# Patient Record
Sex: Female | Born: 1975
Health system: Southern US, Community
[De-identification: ages and names within clinical notes are randomized; demographics above are authoritative.]

---

## 2003-07-07 ENCOUNTER — Other Ambulatory Visit: Admission: RE | Admit: 2003-07-07 | Discharge: 2003-07-07 | Payer: Self-pay | Admitting: Obstetrics and Gynecology

## 2007-11-02 ENCOUNTER — Ambulatory Visit: Payer: Self-pay | Admitting: Oncology

## 2009-05-14 ENCOUNTER — Encounter: Admission: RE | Admit: 2009-05-14 | Discharge: 2009-05-14 | Payer: Self-pay | Admitting: Obstetrics and Gynecology

## 2009-05-15 ENCOUNTER — Encounter: Admission: RE | Admit: 2009-05-15 | Discharge: 2009-05-15 | Payer: Self-pay | Admitting: Obstetrics and Gynecology

## 2010-09-16 ENCOUNTER — Encounter: Payer: Self-pay | Admitting: Obstetrics and Gynecology

## 2010-09-25 ENCOUNTER — Inpatient Hospital Stay (HOSPITAL_COMMUNITY): Admission: AD | Admit: 2010-09-25 | Payer: Self-pay | Admitting: Obstetrics and Gynecology

## 2011-02-25 ENCOUNTER — Other Ambulatory Visit: Payer: Self-pay | Admitting: Obstetrics and Gynecology

## 2011-02-25 ENCOUNTER — Inpatient Hospital Stay (HOSPITAL_COMMUNITY)
Admission: AD | Admit: 2011-02-25 | Discharge: 2011-02-28 | DRG: 765 | Disposition: A | Discharge status: Home / Self Care | Payer: 59 | Source: Ambulatory Visit | Attending: Obstetrics and Gynecology | Admitting: Obstetrics and Gynecology

## 2011-02-25 DIAGNOSIS — Z22330 Carrier of Group B streptococcus: Secondary | ICD-10-CM

## 2011-02-25 DIAGNOSIS — O09519 Supervision of elderly primigravida, unspecified trimester: Secondary | ICD-10-CM | POA: Diagnosis present

## 2011-02-25 DIAGNOSIS — D62 Acute posthemorrhagic anemia: Secondary | ICD-10-CM | POA: Diagnosis not present

## 2011-02-25 DIAGNOSIS — O9903 Anemia complicating the puerperium: Secondary | ICD-10-CM | POA: Diagnosis not present

## 2011-02-25 DIAGNOSIS — O99892 Other specified diseases and conditions complicating childbirth: Secondary | ICD-10-CM | POA: Diagnosis present

## 2011-02-25 LAB — CBC
HCT: 36.9 % (ref 36.0–46.0)
Hemoglobin: 12.7 g/dL (ref 12.0–15.0)
WBC: 12.2 10*3/uL — ABNORMAL HIGH (ref 4.0–10.5)

## 2011-02-26 LAB — CBC
HCT: 28.3 % — ABNORMAL LOW (ref 36.0–46.0)
Hemoglobin: 9.6 g/dL — ABNORMAL LOW (ref 12.0–15.0)
WBC: 13.8 10*3/uL — ABNORMAL HIGH (ref 4.0–10.5)

## 2011-02-26 LAB — RPR: RPR Ser Ql: NONREACTIVE

## 2011-02-27 NOTE — Op Note (Signed)
  NAMEMANUEL, Hailey [name]:  [id number]  MEDICAL RECORD NO.:  [id number]  LOCATION:  9138                          FACILITY:  [facility]  PHYSICIAN:  Maxie Better, M.D.DATE OF BIRTH:  1976-03-10  DATE OF PROCEDURE:  02/25/2011 DATE OF DISCHARGE:                              OPERATIVE REPORT   PREOPERATIVE DIAGNOSIS:  Fetal bradycardia, term gestation, nonreassuring fetal tracing.  PROCEDURE:  Emergency primary cesarean section Kerr hysterotomy.  POSTOPERATIVE DIAGNOSIS:  Fetal bradycardia, nonreassuring fetal tracing, term gestation.  ANESTHESIA:  Epidural.  SURGEON:  Maxie Better, M.D.  ASSISTANT:  None.  PROCEDURE IN DETAIL:  The patient was transferred to the operating room for an emergency cesarean section.  Epidural dosing was increased route to the operating room.  On arrival in the operating room, attempt at external fetal heart rate did not get a heart beat.  The internal scalp electrode had come off.  The patient was quickly prepped and draped.  A Pfannenstiel skin incision was then made, carried down to the rectus fascia.  Rectus fascia was bluntly opened and the parietal peritoneum was entered bluntly and extended.  The vesicouterine peritoneum was opened transversely.  The bladder bluntly dissected off the lower uterine segment.  Bladder retractor was then used to displace the bladder flap. Curvilinear incision was then made and bluntly extended.  Subsequent delivery of a live female from the left occiput posterior position was accomplished.  Baby was bulb suctioned.  The abdomen cord was clamped, cut.  The baby was transferred to awaiting pediatrician who assigned Apgars of 7 and 9 at 1 and 5 minutes.  Placenta was manually removed. Uterine cavity was cleaned of debris.  Uterine incision had no extension.  Cord pH  was 7.19. Cord blood was obtained.  Uterine incision was closed in two layers, the first layer with 0 Monocryl running  locked stitch, second layer was imbricated using 0 Monocryl suture.  Good hemostasis is noted.  Normal tubes and ovaries were noted bilaterally.  The abdomen was then copiously irrigated, suctioned of debris.  The peritoneal edges on the bladder surfaces were cauterized due to small bleeders with good hemostasis noted along the incision line.  The parietal peritoneum was closed with 0 Vicryl.  The rectus fascia was closed with 0 Vicryl x2.  The subcutaneous areas irrigated. Small bleeders cauterized, and interrupted 2-0 plain sutures was placed. Skin was subsequently approximated with Ethicon staples.  SPECIMENS:  Placenta sent to pathology.    COMPLICATIONS:  None.  INTRAOPERATIVE FLUID:  1900 mL.  URINE OUTPUT:  700 mL.  ESTIMATED BLOOD LOSS:  400 mL.  COUNTS:  Sponge and instrument counts x2 was correct.  Weight of the baby was 7 pounds and 6 ounces.  The baby was transferred to the regular nursery.  The patient tolerated the procedure well, was transferred to recovery in stable condition.     Maxie Better, M.D.[place]/MEDQ  D:  02/25/2011  T:  02/26/2011  Job:  [id number]  Electronically Signed by Nena Jordan Kaushal Vannice M.D. on 02/27/2011 06:36:33 AM

## 2011-02-28 NOTE — Discharge Summary (Signed)
  NAMEDARRIAN, [name]:  [id number]  MEDICAL RECORD NO.:  [id number]  LOCATION:  9138                          FACILITY:  [facility]  PHYSICIAN:  Maxie Better, M.D.DATE OF BIRTH:  [birth year]  DATE OF ADMISSION:  02/25/2011 DATE OF DISCHARGE:  02/28/2011                              DISCHARGE SUMMARY   ADMISSION DIAGNOSES:  Labor, term gestation.  DISCHARGE DIAGNOSES:  Term gestation, delivered status post emergency primary cesarean section for fetal bradycardia, nonreassuring fetal tracing, postoperative anemia.  PROCEDURES:  Emergency primary cesarean section, Kerr hysterotomy.  HISTORY OF PRESENT ILLNESS:  A 35 year old gravida 1, para 0 married white female at term presented in labor.  The patient had intact membranes.  Group B strep culture was positive.  Prenatal course had been unremarkable.  HOSPITAL COURSE:  The patient was admitted in early labor.  She was started on Penicillin prophylaxis.  She had epidural for pain management.  She had artificial rupture of membranes due to a fetal heart rate deceleration with clear amniotic fluid noted at that time. Intrauterine fetal scalp electrode and intrauterine pressure catheter were placed at that time.  Due to the fact that the tracing had spaced contractions every 4-4-1/2 minutes, the patient had Pitocin augmentation started.  The patient subsequently had variable decelerations for which amnioinfusion was also started at the time the intrauterine pressure catheter was placed.  She subsequently had deep variable decelerations resulting in a fetal bradycardia, which necessitated a stat primary cesarean section.  The patient underwent a primary cesarean section with resultant delivery of a live female, 7 pounds 6 ounces, and Apgars of 7 and 9, cord pH of 7.19.  Please see the dictated operative report for the specific detail.  The patient had an uncomplicated postoperative course.  Her CBC on postop day  #1 showed a hemoglobin of 9.6, hematocrit of 28.3, white count of 13.8, platelet count of 148,000.  On postop day #3, the patient was tolerating a regular diet, remained afebrile. Incision had no evidence of infection.  Staples were removed.  Steri- Strips placed.  The patient was deemed well to be discharged home. Disposition is home.  Condition stable.  DISCHARGE MEDICATIONS:  Tylox 1-2 tablets every 3-4 hours p.r.n. pain, Motrin 800 mg 1 p.o. q.6-8 hours p.r.n. pain, Valtrex 500 mg 1 p.o. daily.  Followup appointment at [facility] in 6 weeks.  Discharge instructions per the postpartum booklet given and reviewed with the patient.     Maxie Better, M.D.     [place]/MEDQ  D:  02/28/2011  T:  02/28/2011  Job:  [id number]  Electronically Signed by Nena Jordan Colton Tassin M.D. on 02/28/2011 10:07:01 PM

## 2011-03-03 ENCOUNTER — Inpatient Hospital Stay (HOSPITAL_COMMUNITY)
Admission: AD | Admit: 2011-03-03 | Payer: BC Managed Care – PPO | Source: Ambulatory Visit | Admitting: Obstetrics and Gynecology

## 2012-06-01 ENCOUNTER — Other Ambulatory Visit: Payer: Self-pay | Admitting: Obstetrics and Gynecology

## 2012-06-01 DIAGNOSIS — Z1231 Encounter for screening mammogram for malignant neoplasm of breast: Secondary | ICD-10-CM

## 2012-07-01 ENCOUNTER — Ambulatory Visit
Admission: RE | Admit: 2012-07-01 | Discharge: 2012-07-01 | Disposition: A | Discharge status: Home / Self Care | Payer: BC Managed Care – PPO | Source: Ambulatory Visit | Attending: Obstetrics and Gynecology | Admitting: Obstetrics and Gynecology

## 2012-07-01 DIAGNOSIS — Z1231 Encounter for screening mammogram for malignant neoplasm of breast: Secondary | ICD-10-CM

## 2013-06-06 ENCOUNTER — Other Ambulatory Visit: Payer: Self-pay

## 2013-06-06 DIAGNOSIS — Z1231 Encounter for screening mammogram for malignant neoplasm of breast: Secondary | ICD-10-CM

## 2013-07-05 ENCOUNTER — Ambulatory Visit
Admission: RE | Admit: 2013-07-05 | Discharge: 2013-07-05 | Disposition: A | Discharge status: Home / Self Care | Payer: BC Managed Care – PPO | Source: Ambulatory Visit

## 2013-07-05 DIAGNOSIS — Z1231 Encounter for screening mammogram for malignant neoplasm of breast: Secondary | ICD-10-CM

## 2014-07-31 ENCOUNTER — Other Ambulatory Visit: Payer: Self-pay

## 2014-07-31 DIAGNOSIS — Z1231 Encounter for screening mammogram for malignant neoplasm of breast: Secondary | ICD-10-CM

## 2014-09-07 ENCOUNTER — Ambulatory Visit: Payer: BC Managed Care – PPO

## 2016-05-14 DIAGNOSIS — L814 Other melanin hyperpigmentation: Secondary | ICD-10-CM | POA: Diagnosis not present

## 2016-05-14 DIAGNOSIS — D225 Melanocytic nevi of trunk: Secondary | ICD-10-CM | POA: Diagnosis not present

## 2016-05-14 DIAGNOSIS — D2271 Melanocytic nevi of right lower limb, including hip: Secondary | ICD-10-CM | POA: Diagnosis not present

## 2016-05-14 DIAGNOSIS — L821 Other seborrheic keratosis: Secondary | ICD-10-CM | POA: Diagnosis not present

## 2016-10-30 DIAGNOSIS — D485 Neoplasm of uncertain behavior of skin: Secondary | ICD-10-CM | POA: Diagnosis not present

## 2016-11-05 DIAGNOSIS — Z6821 Body mass index (BMI) 21.0-21.9, adult: Secondary | ICD-10-CM | POA: Diagnosis not present

## 2016-11-05 DIAGNOSIS — Z01419 Encounter for gynecological examination (general) (routine) without abnormal findings: Secondary | ICD-10-CM | POA: Diagnosis not present

## 2016-11-05 DIAGNOSIS — Z1151 Encounter for screening for human papillomavirus (HPV): Secondary | ICD-10-CM | POA: Diagnosis not present

## 2016-11-05 DIAGNOSIS — R87610 Atypical squamous cells of undetermined significance on cytologic smear of cervix (ASC-US): Secondary | ICD-10-CM | POA: Diagnosis not present

## 2016-11-05 DIAGNOSIS — Z1231 Encounter for screening mammogram for malignant neoplasm of breast: Secondary | ICD-10-CM | POA: Diagnosis not present

## 2016-11-19 DIAGNOSIS — B078 Other viral warts: Secondary | ICD-10-CM | POA: Diagnosis not present

## 2017-03-10 DIAGNOSIS — H6123 Impacted cerumen, bilateral: Secondary | ICD-10-CM | POA: Diagnosis not present

## 2017-07-15 DIAGNOSIS — D224 Melanocytic nevi of scalp and neck: Secondary | ICD-10-CM | POA: Diagnosis not present

## 2017-07-15 DIAGNOSIS — D225 Melanocytic nevi of trunk: Secondary | ICD-10-CM | POA: Diagnosis not present

## 2017-07-15 DIAGNOSIS — D2262 Melanocytic nevi of left upper limb, including shoulder: Secondary | ICD-10-CM | POA: Diagnosis not present

## 2017-07-15 DIAGNOSIS — D2261 Melanocytic nevi of right upper limb, including shoulder: Secondary | ICD-10-CM | POA: Diagnosis not present

## 2018-01-05 DIAGNOSIS — Z1151 Encounter for screening for human papillomavirus (HPV): Secondary | ICD-10-CM | POA: Diagnosis not present

## 2018-01-05 DIAGNOSIS — Z01419 Encounter for gynecological examination (general) (routine) without abnormal findings: Secondary | ICD-10-CM | POA: Diagnosis not present

## 2018-01-05 DIAGNOSIS — Z1231 Encounter for screening mammogram for malignant neoplasm of breast: Secondary | ICD-10-CM | POA: Diagnosis not present

## 2018-01-05 DIAGNOSIS — Z6821 Body mass index (BMI) 21.0-21.9, adult: Secondary | ICD-10-CM | POA: Diagnosis not present

## 2018-01-10 DIAGNOSIS — S40262A Insect bite (nonvenomous) of left shoulder, initial encounter: Secondary | ICD-10-CM | POA: Diagnosis not present

## 2018-01-10 DIAGNOSIS — L089 Local infection of the skin and subcutaneous tissue, unspecified: Secondary | ICD-10-CM | POA: Diagnosis not present

## 2018-01-10 DIAGNOSIS — S40862A Insect bite (nonvenomous) of left upper arm, initial encounter: Secondary | ICD-10-CM | POA: Diagnosis not present

## 2018-01-10 DIAGNOSIS — W57XXXA Bitten or stung by nonvenomous insect and other nonvenomous arthropods, initial encounter: Secondary | ICD-10-CM | POA: Diagnosis not present

## 2018-02-10 ENCOUNTER — Ambulatory Visit (INDEPENDENT_AMBULATORY_CARE_PROVIDER_SITE_OTHER): Payer: BLUE CROSS/BLUE SHIELD | Admitting: Physician Assistant

## 2018-02-10 ENCOUNTER — Other Ambulatory Visit: Payer: Self-pay

## 2018-02-10 ENCOUNTER — Encounter: Payer: Self-pay | Admitting: Physician Assistant

## 2018-02-10 VITALS — BP 92/60 | HR 55 | Temp 97.7°F | Resp 16 | Ht 68.0 in | Wt 138.6 lb

## 2018-02-10 DIAGNOSIS — F418 Other specified anxiety disorders: Secondary | ICD-10-CM

## 2018-02-10 NOTE — Patient Instructions (Signed)
We could consider low dose propanolol for times when you know you have a long drive.  An SSRI, like lexapro, can be helpful with anxiety an form of anxiety, but it is not taken as needed, and may decrease concern for other things.

## 2018-02-10 NOTE — Progress Notes (Signed)
02/10/2018 10:58 AM   DOB: 08-15-1976 / MRN: [id number]  SUBJECTIVE:  Hailey Alexander is a 42 y.o. female presenting for anxiety.  She tells me that she has a fear of driving in heavy traffic and states that "I am white knuckled into reactive behind the wheel."  She denies any road rage symptoms and prefers to stay in the right-hand lane to let other cars passed without difficulty.  She is essentially worried that somebody will not be paying attention and hit her while she is driving her car.  She sees Laurance Flatten often for life decisions and general counseling and Pamala Hurry recommended that she come here and talk to me about the possibility of medications.  Patient denies any other symptoms of anxiety does not think about her drive at night, before her drive in the morning or her drive home at night until she is actually behind the wheel.  Symptoms present for 4 years now and worsening. She has tried talking to her therapist about the problem.  She sleeps well and denies feelings of sadness and anhedonia.  Depression screen PHQ 2/9 02/10/2018  Decreased Interest 0  Down, Depressed, Hopeless 0  PHQ - 2 Score 0   She is allergic to codeine.   Review of Systems  Constitutional: Negative for chills and fever.  Skin: Negative for itching and rash.  Psychiatric/Behavioral: Negative for depression, hallucinations, memory loss, substance abuse and suicidal ideas. The patient is nervous/anxious. The patient does not have insomnia.     The problem list and medications were reviewed and updated by myself where necessary and exist elsewhere in the encounter.   OBJECTIVE:  BP 92/60 (BP Location: Right Arm, Patient Position: Sitting, Cuff Size: Normal)   Pulse (!) 55   Temp 97.7 F (36.5 C) (Oral)   Resp 16   Ht 5' 8"  (1.727 m)   Wt 138 lb 9.6 oz (62.9 kg)   LMP 01/27/2018   SpO2 99%   BMI 21.07 kg/m   Wt Readings from Last 3 Encounters:  02/10/18 138 lb 9.6 oz (62.9 kg)   Temp Readings  from Last 3 Encounters:  02/10/18 97.7 F (36.5 C) (Oral)   BP Readings from Last 3 Encounters:  02/10/18 92/60   Pulse Readings from Last 3 Encounters:  02/10/18 (!) 55    Physical Exam  Constitutional: She is oriented to person, place, and time. She appears well-nourished. No distress.  Eyes: Pupils are equal, round, and reactive to light. EOM are normal.  Cardiovascular: Normal rate.  Pulmonary/Chest: Effort normal.  Abdominal: She exhibits no distension.  Neurological: She is alert and oriented to person, place, and time. She has normal reflexes. No cranial nerve deficit or sensory deficit. Gait normal.  Skin: Skin is dry. She is not diaphoretic.  Psychiatric: She has a normal mood and affect. Her behavior is normal. Judgment and thought content normal. Her mood appears not anxious. Her affect is not angry, not blunt, not labile and not inappropriate. Her speech is not rapid and/or pressured. She is not agitated and not actively hallucinating. Cognition and memory are not impaired. She does not express impulsivity or inappropriate judgment. She does not exhibit a depressed mood. She exhibits normal recent memory and normal remote memory. She is attentive.  Vitals reviewed.       ASSESSMENT AND PLAN:  Anastasya was seen today for anxiety.  Diagnoses and all orders for this visit:  Situational anxiety: I think her fear of driving is within the  spectrum of normal.  She does not ruminate on driving before or after her drive and it does not appear that there are any other instances in her life that cause her undue anxiety.  I think her fears of driving her proportional to living [place]  [place] that [place] major highway [place].  I talked about the benefits of an SSRI and feel that this may be too much for her symptoms given that her anxiousness is isolated to essentially busy roadways.  We could consider propanolol for long drives.    The patient is advised to call or return to  clinic if she does not see an improvement in symptoms, or to seek the care of the closest emergency department if she worsens with the above plan.   Philis Fendt, MHS, PA-C [facility] 02/10/2018 10:58 AM

## 2018-08-16 DIAGNOSIS — D2261 Melanocytic nevi of right upper limb, including shoulder: Secondary | ICD-10-CM | POA: Diagnosis not present

## 2018-08-16 DIAGNOSIS — D2262 Melanocytic nevi of left upper limb, including shoulder: Secondary | ICD-10-CM | POA: Diagnosis not present

## 2018-08-16 DIAGNOSIS — D225 Melanocytic nevi of trunk: Secondary | ICD-10-CM | POA: Diagnosis not present

## 2018-08-16 DIAGNOSIS — D2271 Melanocytic nevi of right lower limb, including hip: Secondary | ICD-10-CM | POA: Diagnosis not present

## 2018-09-27 DIAGNOSIS — M9903 Segmental and somatic dysfunction of lumbar region: Secondary | ICD-10-CM | POA: Diagnosis not present

## 2018-09-27 DIAGNOSIS — M545 Low back pain: Secondary | ICD-10-CM | POA: Diagnosis not present

## 2018-09-27 DIAGNOSIS — M9905 Segmental and somatic dysfunction of pelvic region: Secondary | ICD-10-CM | POA: Diagnosis not present

## 2018-09-27 DIAGNOSIS — M9904 Segmental and somatic dysfunction of sacral region: Secondary | ICD-10-CM | POA: Diagnosis not present

## 2018-09-29 DIAGNOSIS — M9903 Segmental and somatic dysfunction of lumbar region: Secondary | ICD-10-CM | POA: Diagnosis not present

## 2018-09-29 DIAGNOSIS — M9905 Segmental and somatic dysfunction of pelvic region: Secondary | ICD-10-CM | POA: Diagnosis not present

## 2018-09-29 DIAGNOSIS — M9904 Segmental and somatic dysfunction of sacral region: Secondary | ICD-10-CM | POA: Diagnosis not present

## 2018-09-29 DIAGNOSIS — M545 Low back pain: Secondary | ICD-10-CM | POA: Diagnosis not present

## 2018-11-01 DIAGNOSIS — M545 Low back pain: Secondary | ICD-10-CM | POA: Diagnosis not present

## 2018-11-05 DIAGNOSIS — M545 Low back pain: Secondary | ICD-10-CM | POA: Diagnosis not present

## 2019-02-07 DIAGNOSIS — R300 Dysuria: Secondary | ICD-10-CM | POA: Diagnosis not present

## 2019-02-07 DIAGNOSIS — N3000 Acute cystitis without hematuria: Secondary | ICD-10-CM | POA: Diagnosis not present

## 2019-03-10 DIAGNOSIS — Z1151 Encounter for screening for human papillomavirus (HPV): Secondary | ICD-10-CM | POA: Diagnosis not present

## 2019-03-10 DIAGNOSIS — Z6821 Body mass index (BMI) 21.0-21.9, adult: Secondary | ICD-10-CM | POA: Diagnosis not present

## 2019-03-10 DIAGNOSIS — Z1231 Encounter for screening mammogram for malignant neoplasm of breast: Secondary | ICD-10-CM | POA: Diagnosis not present

## 2019-03-10 DIAGNOSIS — Z01419 Encounter for gynecological examination (general) (routine) without abnormal findings: Secondary | ICD-10-CM | POA: Diagnosis not present

## 2019-03-14 ENCOUNTER — Other Ambulatory Visit: Payer: Self-pay | Admitting: Obstetrics and Gynecology

## 2019-03-14 DIAGNOSIS — E049 Nontoxic goiter, unspecified: Secondary | ICD-10-CM

## 2019-03-17 ENCOUNTER — Ambulatory Visit
Admission: RE | Admit: 2019-03-17 | Discharge: 2019-03-17 | Disposition: A | Discharge status: Home / Self Care | Payer: BC Managed Care – PPO | Source: Ambulatory Visit | Attending: Obstetrics and Gynecology | Admitting: Obstetrics and Gynecology

## 2019-03-17 DIAGNOSIS — E042 Nontoxic multinodular goiter: Secondary | ICD-10-CM | POA: Diagnosis not present

## 2019-03-17 DIAGNOSIS — E049 Nontoxic goiter, unspecified: Secondary | ICD-10-CM

## 2019-03-22 ENCOUNTER — Other Ambulatory Visit: Payer: Self-pay | Admitting: Obstetrics and Gynecology

## 2019-03-22 DIAGNOSIS — E041 Nontoxic single thyroid nodule: Secondary | ICD-10-CM

## 2019-04-07 ENCOUNTER — Other Ambulatory Visit (HOSPITAL_COMMUNITY)
Admission: RE | Admit: 2019-04-07 | Discharge: 2019-04-07 | Disposition: A | Discharge status: Home / Self Care | Payer: BC Managed Care – PPO | Source: Ambulatory Visit | Attending: Radiology | Admitting: Radiology

## 2019-04-07 ENCOUNTER — Ambulatory Visit
Admission: RE | Admit: 2019-04-07 | Discharge: 2019-04-07 | Disposition: A | Discharge status: Home / Self Care | Payer: BC Managed Care – PPO | Source: Ambulatory Visit | Attending: Obstetrics and Gynecology | Admitting: Obstetrics and Gynecology

## 2019-04-07 DIAGNOSIS — E041 Nontoxic single thyroid nodule: Secondary | ICD-10-CM

## 2019-04-07 DIAGNOSIS — D34 Benign neoplasm of thyroid gland: Secondary | ICD-10-CM | POA: Diagnosis not present

## 2019-05-26 DIAGNOSIS — D485 Neoplasm of uncertain behavior of skin: Secondary | ICD-10-CM | POA: Diagnosis not present

## 2019-10-20 DIAGNOSIS — L720 Epidermal cyst: Secondary | ICD-10-CM | POA: Diagnosis not present

## 2019-10-20 DIAGNOSIS — D2271 Melanocytic nevi of right lower limb, including hip: Secondary | ICD-10-CM | POA: Diagnosis not present

## 2019-10-20 DIAGNOSIS — D225 Melanocytic nevi of trunk: Secondary | ICD-10-CM | POA: Diagnosis not present

## 2019-10-20 DIAGNOSIS — D2261 Melanocytic nevi of right upper limb, including shoulder: Secondary | ICD-10-CM | POA: Diagnosis not present

## 2020-05-02 ENCOUNTER — Other Ambulatory Visit: Payer: Self-pay | Admitting: Body Imaging

## 2020-05-02 DIAGNOSIS — Z1231 Encounter for screening mammogram for malignant neoplasm of breast: Secondary | ICD-10-CM

## 2020-06-05 ENCOUNTER — Other Ambulatory Visit: Payer: Self-pay

## 2020-06-05 ENCOUNTER — Ambulatory Visit
Admission: RE | Admit: 2020-06-05 | Discharge: 2020-06-05 | Disposition: A | Discharge status: Home / Self Care | Payer: BC Managed Care – PPO | Source: Ambulatory Visit | Attending: Body Imaging | Admitting: Body Imaging

## 2020-06-05 DIAGNOSIS — Z1231 Encounter for screening mammogram for malignant neoplasm of breast: Secondary | ICD-10-CM

## 2020-06-21 DIAGNOSIS — Z124 Encounter for screening for malignant neoplasm of cervix: Secondary | ICD-10-CM | POA: Diagnosis not present

## 2020-06-21 DIAGNOSIS — Z13220 Encounter for screening for lipoid disorders: Secondary | ICD-10-CM | POA: Diagnosis not present

## 2020-06-21 DIAGNOSIS — Z13228 Encounter for screening for other metabolic disorders: Secondary | ICD-10-CM | POA: Diagnosis not present

## 2020-06-21 DIAGNOSIS — N951 Menopausal and female climacteric states: Secondary | ICD-10-CM | POA: Diagnosis not present

## 2020-06-21 DIAGNOSIS — Z131 Encounter for screening for diabetes mellitus: Secondary | ICD-10-CM | POA: Diagnosis not present

## 2020-06-21 DIAGNOSIS — Z1329 Encounter for screening for other suspected endocrine disorder: Secondary | ICD-10-CM | POA: Diagnosis not present

## 2020-06-21 DIAGNOSIS — Z01419 Encounter for gynecological examination (general) (routine) without abnormal findings: Secondary | ICD-10-CM | POA: Diagnosis not present

## 2020-07-04 IMAGING — US ULTRASOUND FNA BIOPSY THYROID 1ST LESION
1 series · 13 of 17 positions shown · non-contrast
Comparison: Thyroid ultrasound dated 03/17/2019

MEDICATIONS:
None

COMPLICATIONS:
None immediate.

INDICATION: Patient with history of thyromegaly and thyroid ultrasound on
03/17/2019 which revealed bilateral nodules with a 4 cm cystic/solid
inferior right nodule which meets criteria for biopsy. She presents
today for the procedure.

EXAM:
ULTRASOUND GUIDED FINE NEEDLE ASPIRATION BIOPSY OF RIGHT INFERIOR
CYSTIC/SOLID THYROID NODULE
TECHNIQUE: Informed written consent was obtained from the patient after a
discussion of the risks, benefits and alternatives to treatment.
Questions regarding the procedure were encouraged and answered. A
timeout was performed prior to the initiation of the procedure.

[Series 1: ultrasound fna biopsy thyroid 1st lesion · 0.06mm/px · 17 acquisitions, 13 frames shown]
[im 1/17]
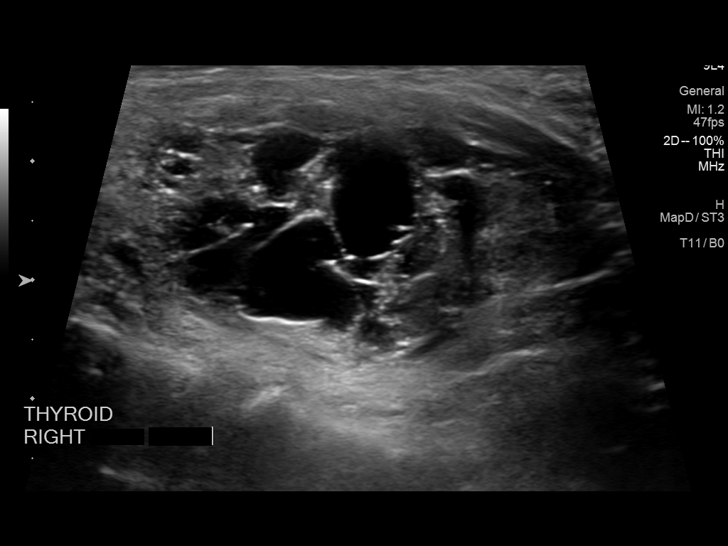
[im 2/17]
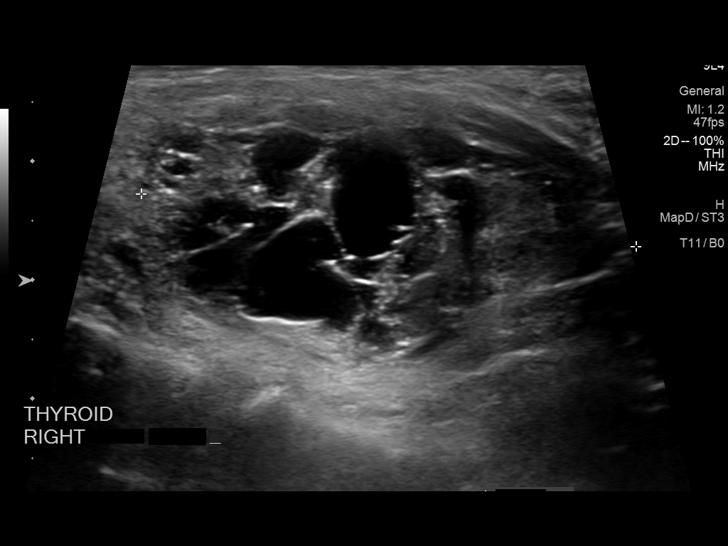
[im 4/17]
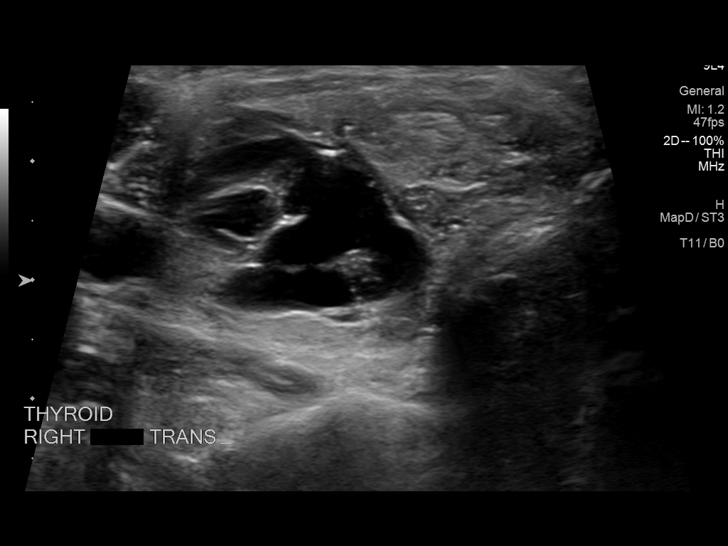
[im 5/17]
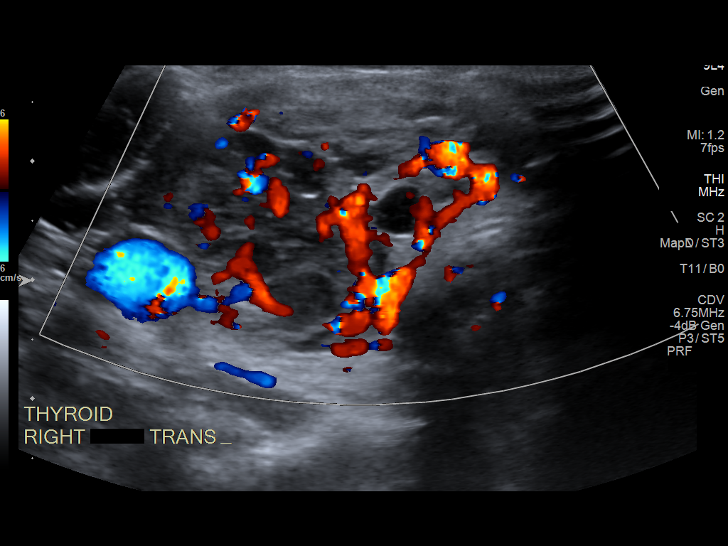
[im 6/17]
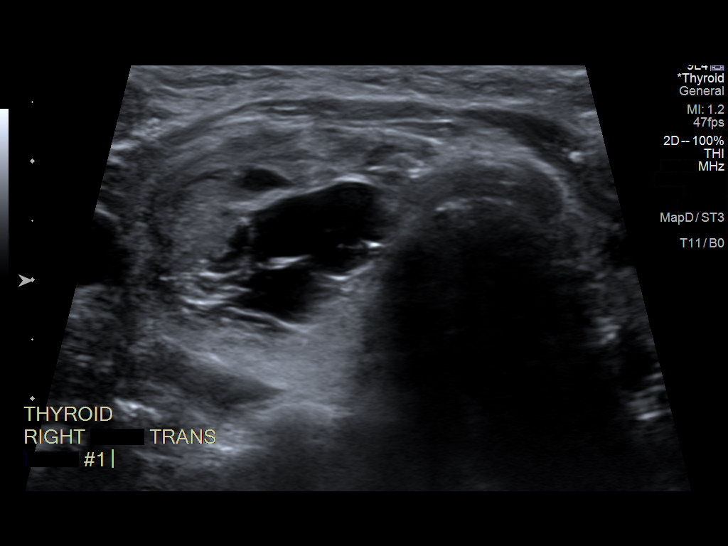
[im 8/17]
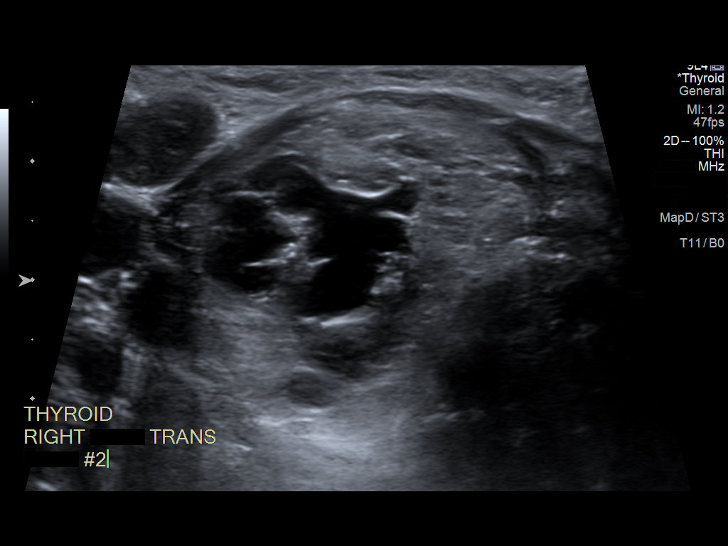
[im 9/17]
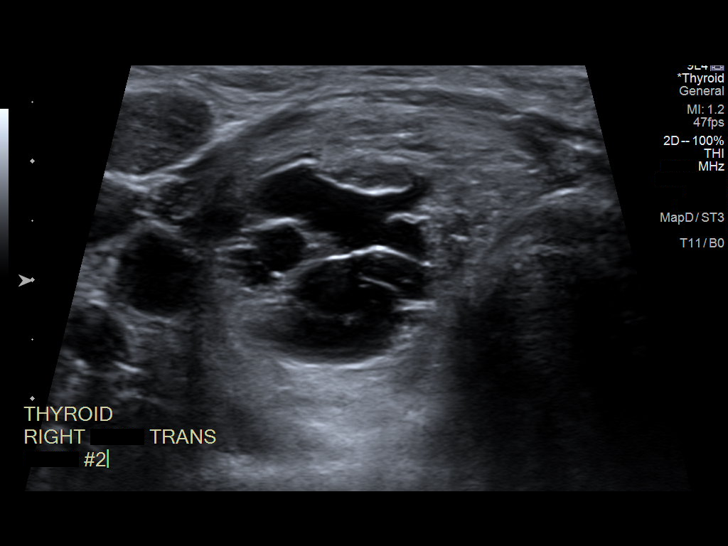
[im 10/17]
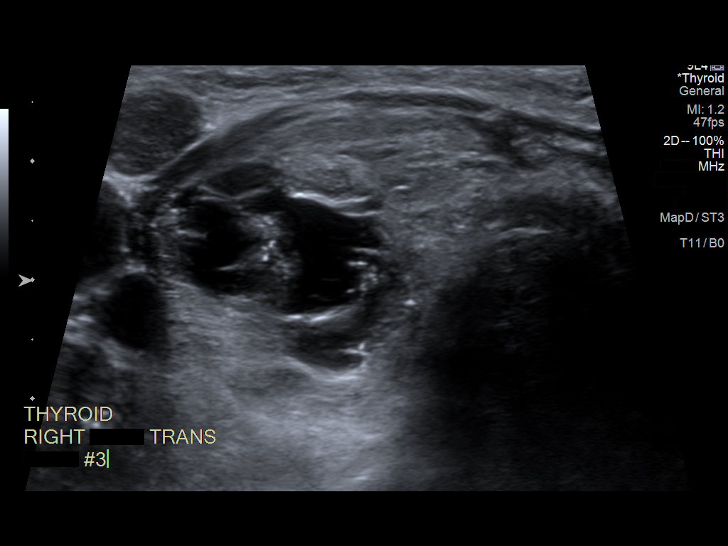
[im 12/17]
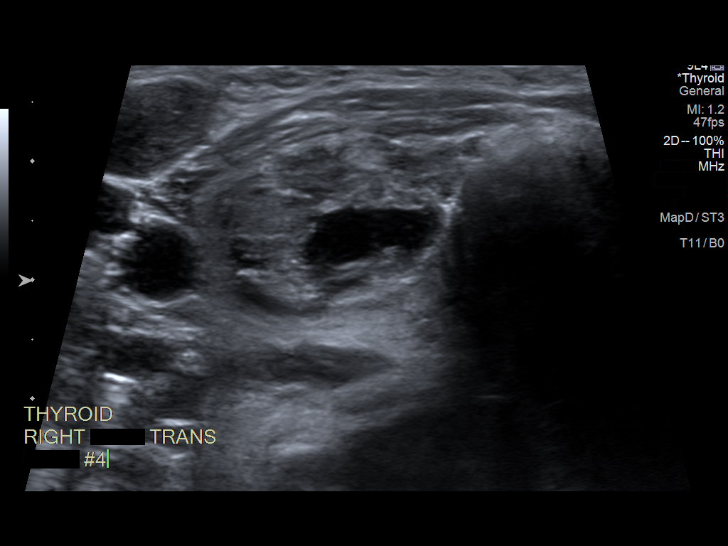
[im 13/17]
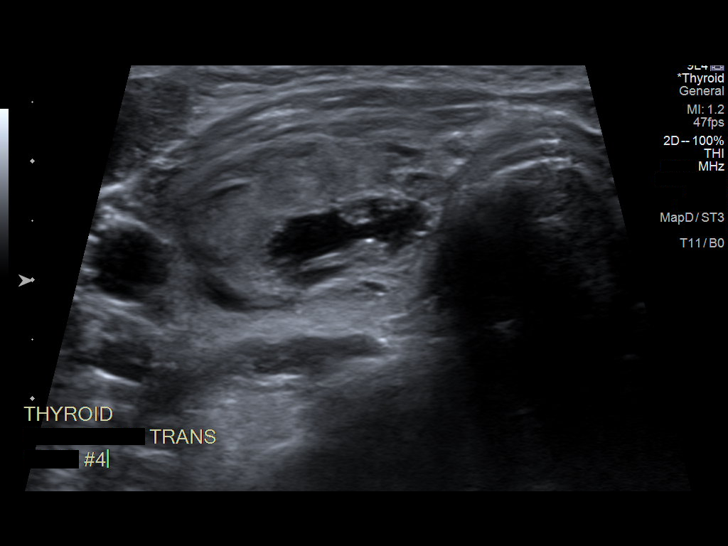
[im 14/17]
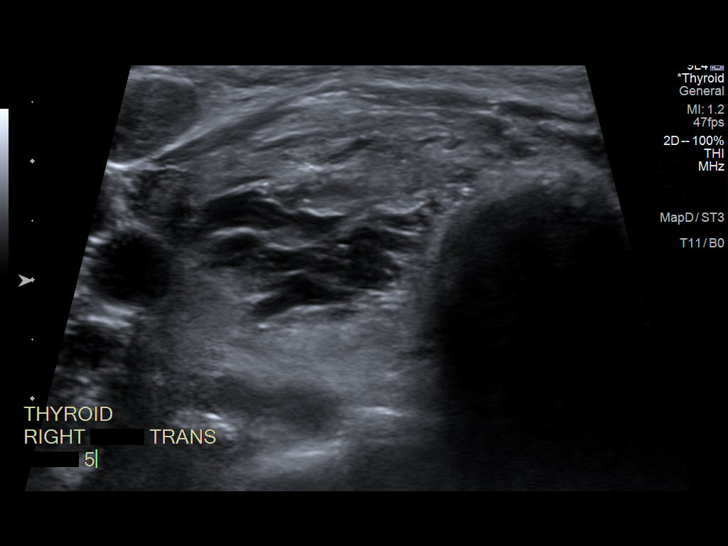
[im 16/17]
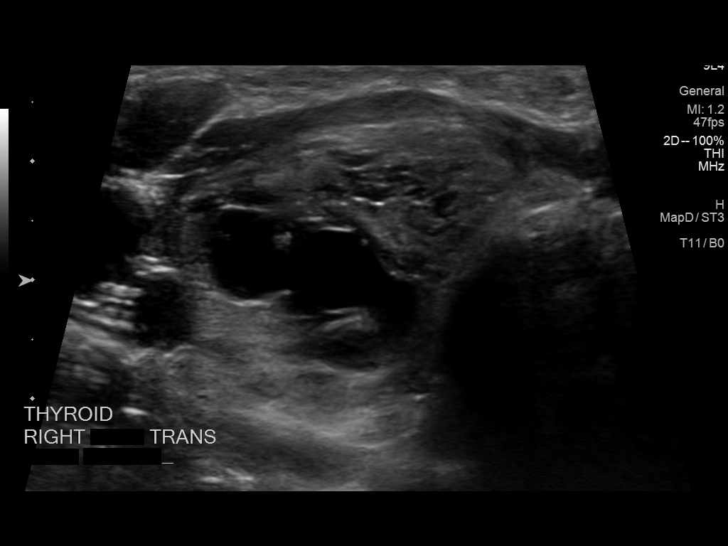
[im 17/17]
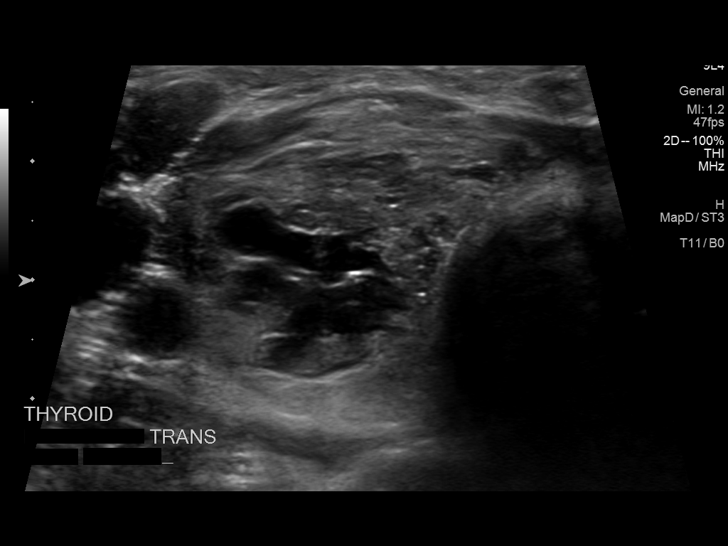

[13 of 17 positions shown; findings below may reference images not displayed]

Pre-procedural ultrasound scanning demonstrated unchanged size and
appearance of the indeterminate nodule within the right inferior
thyroid lobe

The procedure was planned. The neck was prepped in the usual sterile
fashion, and a sterile drape was applied covering the operative
field. A timeout was performed prior to the initiation of the
procedure. Local anesthesia was provided with 1% lidocaine.

Under direct ultrasound guidance, 5 FNA biopsies were performed of
the right inferior thyroid solid/cystic nodule with 25 gauge
needles. Multiple ultrasound images were saved for procedural
documentation purposes. The samples were prepared and submitted to
pathology as well as for Afirma testing. Approximately 2-3 cc of
light blood-tinged fluid was removed from the cystic portion.

Limited post procedural scanning was negative for hematoma or
additional complication. Dressings were placed. The patient
tolerated the above procedures procedure well without immediate
postprocedural complication.
FINDINGS: Nodule reference number based on prior diagnostic ultrasound: 3

Maximum size: 4 cm

Location: Right; Inferior

ACR TI-RADS risk category: TR3 (3 points)

Reason for biopsy: meets ACR TI-RADS criteria

Ultrasound imaging confirms appropriate placement of the needles
within the right inferior thyroid nodule.
IMPRESSION: Technically successful ultrasound guided fine needle aspiration
biopsy of solid/cystic right inferior thyroid nodule. Final
pathology pending.

## 2020-08-10 IMAGING — US US THYROID
1 series · 13 of 25 positions shown · non-contrast
Comparison: None.

CLINICAL DATA: Thyromegaly

EXAM:
THYROID ULTRASOUND
TECHNIQUE: Ultrasound examination of the thyroid gland and adjacent soft
tissues was performed.

[Series 1: us thyroid · 0.07mm/px · 13 of 51 slices shown]
[im 1/51]
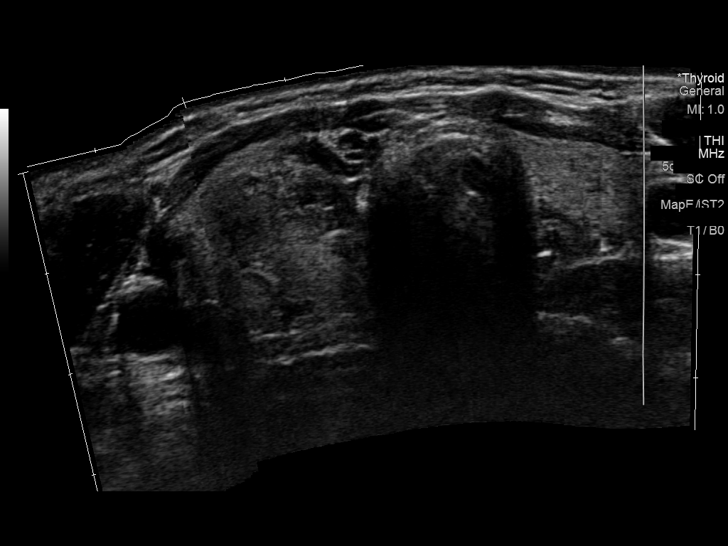
[im 5/51]
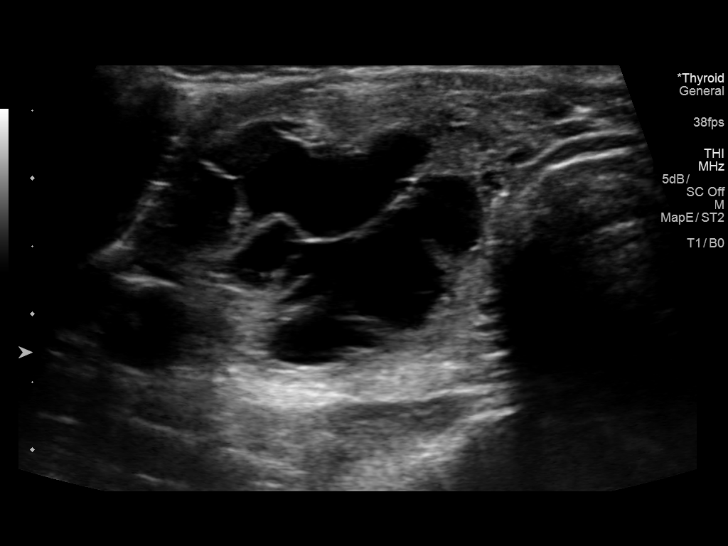
[im 9/51]
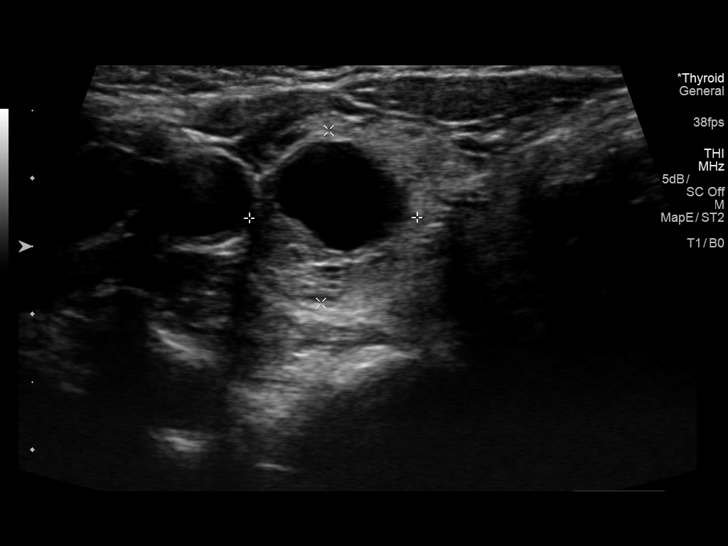
[im 13/51]
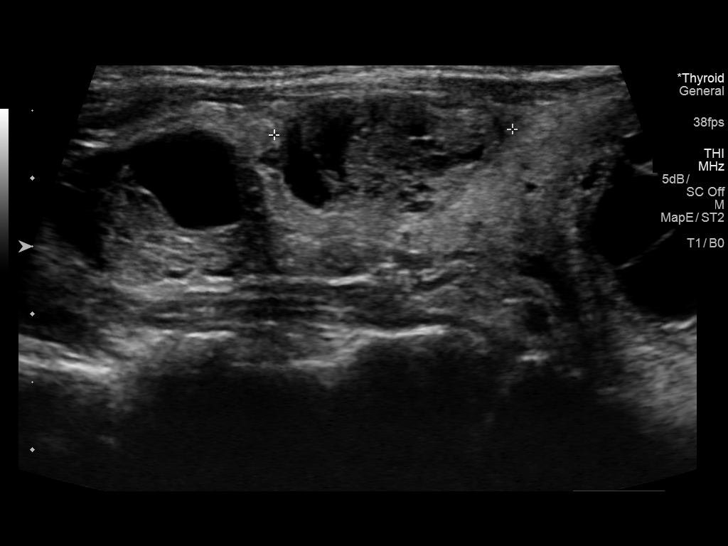
[im 17/51]
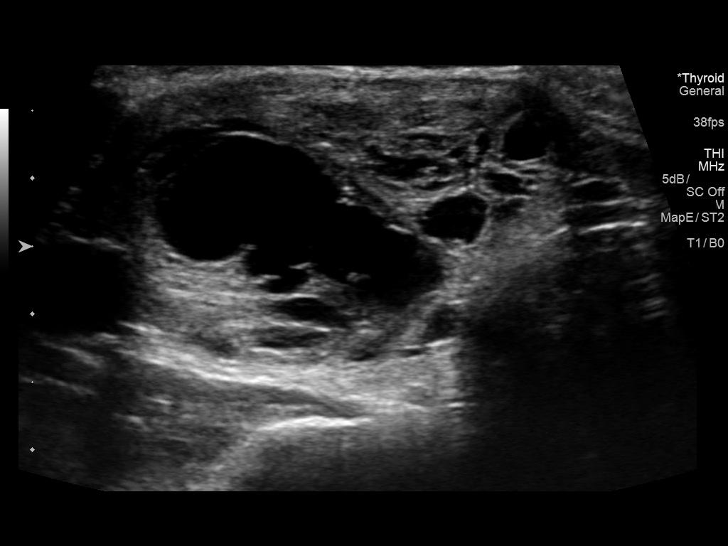
[im 21/51]
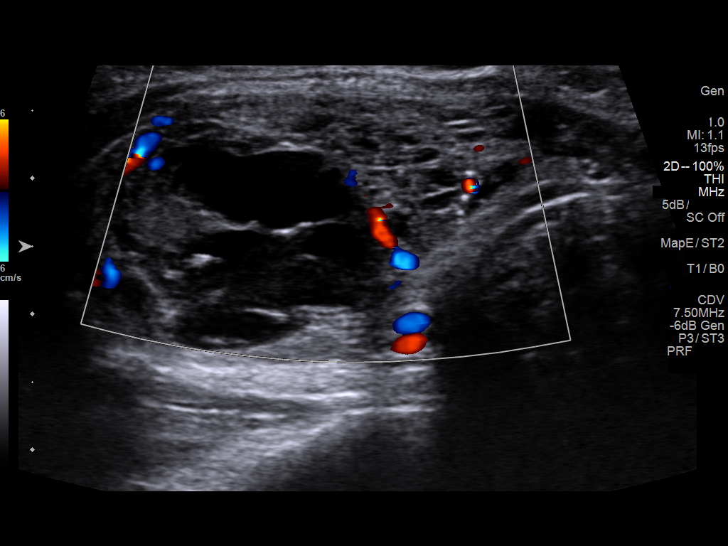
[im 26/51]
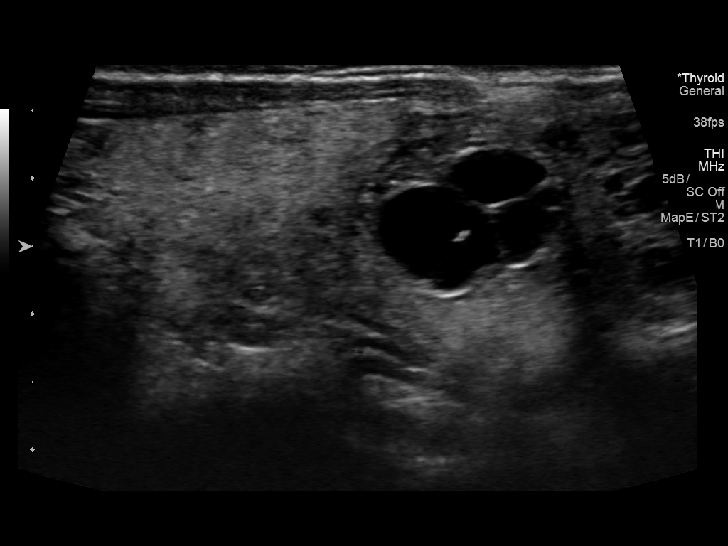
[im 30/51]
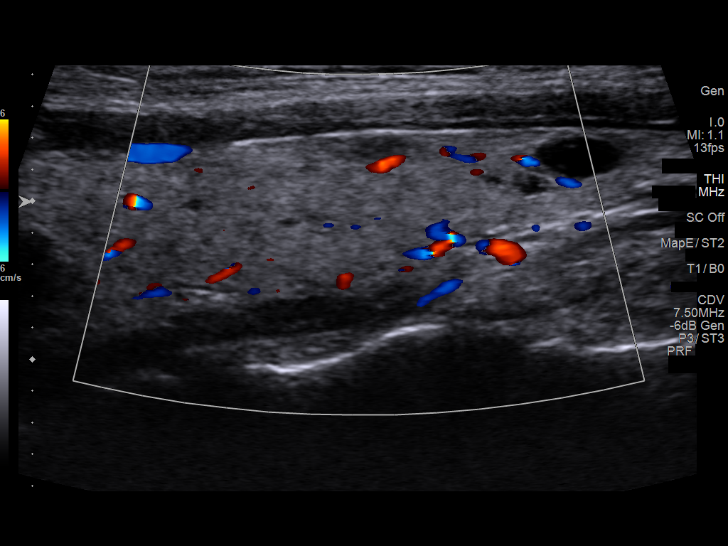
[im 34/51]
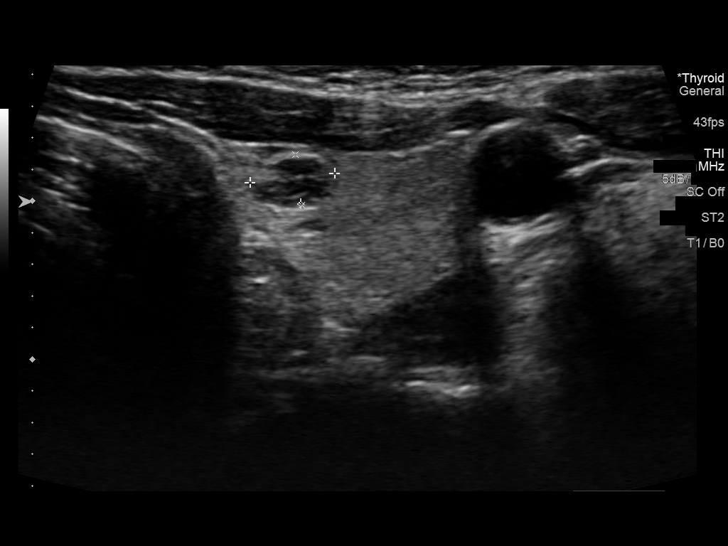
[im 38/51]
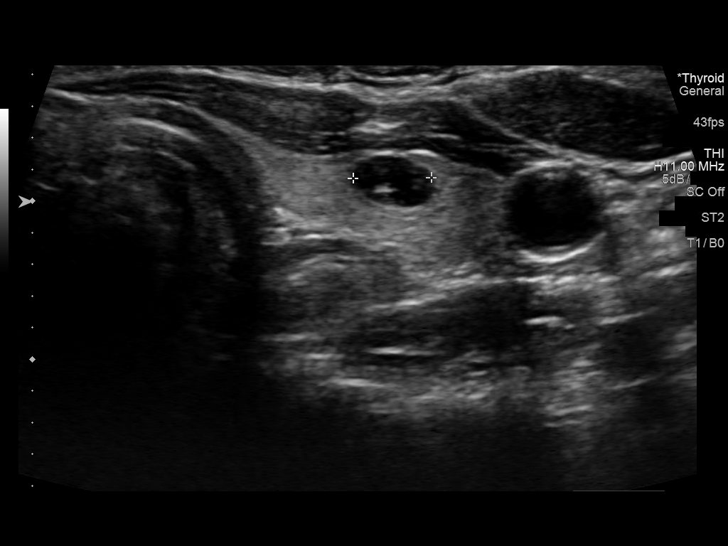
[im 42/51]
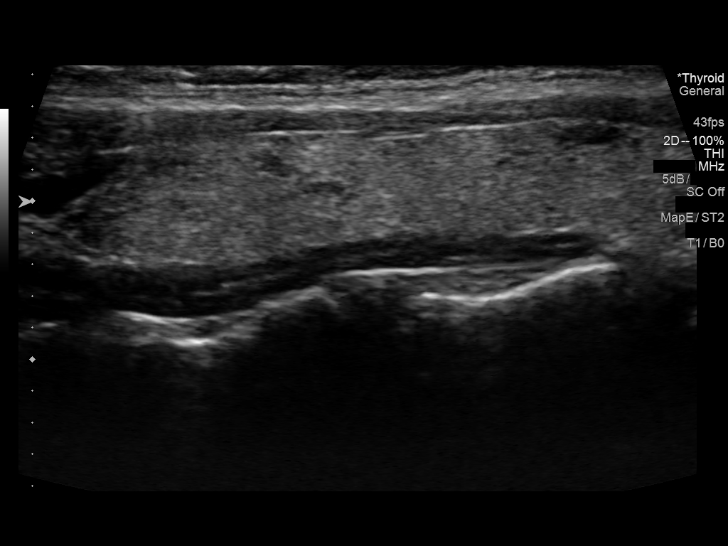
[im 46/51]
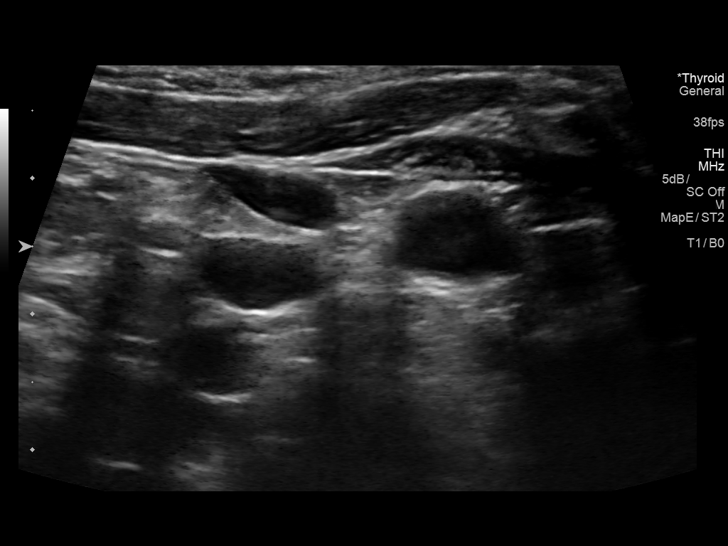
[im 51/51]
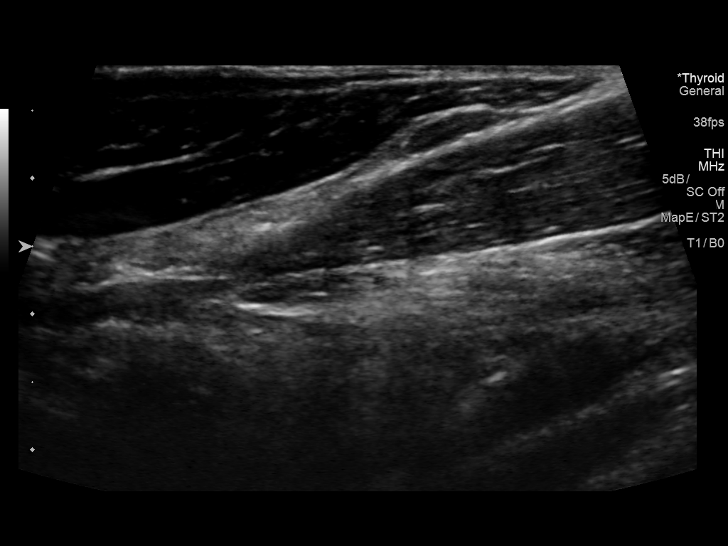

[13 of 25 positions shown; findings below may reference images not displayed]

FINDINGS: Parenchymal Echotexture: Mildly heterogenous

Isthmus: 0.2 cm thickness

Right lobe: 8 x 2.4 x 2.7 cm

Left lobe: 5.2 x 0.8 x 1.8 cm

_________________________________________________________

Estimated total number of nodules >/= 1 cm: 3

Number of spongiform nodules >/=  2 cm not described below (TR1): 0

Number of mixed cystic and solid nodules >/= 1.5 cm not described
below (TR2): 0

_________________________________________________________

Nodule # 1:

Location: Right; Superior

Maximum size: 1.7 cm; Other 2 dimensions: 1.3 x 1.2 cm

Composition: mixed cystic and solid (1)

Echogenicity: isoechoic (1)

Shape: not taller-than-wide (0)

Margins: smooth (0)

Echogenic foci: none (0)

ACR TI-RADS total points: 2.

ACR TI-RADS risk category: TR2 (2 points).

ACR TI-RADS recommendations:

This nodule does NOT meet TI-RADS criteria for biopsy or dedicated
follow-up.

_________________________________________________________

Nodule # 2:

Location: Right; Mid

Maximum size: 1.8 cm; Other 2 dimensions: 1.5 x 1 cm

Composition: spongiform (0)

Echogenicity: hypoechoic (2)

Shape: not taller-than-wide (0)

Margins: ill-defined (0)

Echogenic foci: none (0)

ACR TI-RADS total points: 2.

ACR TI-RADS risk category: TR2 (2 points).

ACR TI-RADS recommendations:

This nodule does NOT meet TI-RADS criteria for biopsy or dedicated
follow-up.

_________________________________________________________

Nodule # 3:

Location: Right; Inferior

Maximum size: 4 cm; Other 2 dimensions: 3.1 x 2.1 cm

Composition: mixed cystic and solid (1)

Echogenicity: hypoechoic (2)

Shape: not taller-than-wide (0)

Margins: ill-defined (0)

Echogenic foci: none (0)

ACR TI-RADS total points: 3.

ACR TI-RADS risk category: TR3 (3 points).

ACR TI-RADS recommendations:

**Given size (>/= 2.5 cm) and appearance, fine needle aspiration of
this mildly suspicious nodule should be considered based on TI-RADS
criteria.

_________________________________________________________

0.5 cm complex cystic nodule without calcifications, mid left; This
nodule does NOT meet TI-RADS criteria for biopsy or dedicated
follow-up.
IMPRESSION: 1. Thyromegaly with bilateral nodules.
2. Recommend FNA biopsy of 4 cm mildly suspicious inferior right
nodule.

The above is in keeping with the ACR TI-RADS recommendations - [HOSPITAL] 4420;[DATE].

## 2021-07-01 ENCOUNTER — Other Ambulatory Visit: Payer: Self-pay | Admitting: Body Imaging

## 2021-07-01 DIAGNOSIS — Z1231 Encounter for screening mammogram for malignant neoplasm of breast: Secondary | ICD-10-CM

## 2021-07-06 ENCOUNTER — Ambulatory Visit
Admission: RE | Admit: 2021-07-06 | Discharge: 2021-07-06 | Disposition: A | Discharge status: Home / Self Care | Payer: BC Managed Care – PPO | Source: Ambulatory Visit | Attending: Body Imaging | Admitting: Body Imaging

## 2021-07-06 DIAGNOSIS — Z1231 Encounter for screening mammogram for malignant neoplasm of breast: Secondary | ICD-10-CM

## 2021-09-02 IMAGING — MG DIGITAL SCREENING BILAT W/ TOMO W/ CAD
6 of 10 series · 6 of 30 positions shown · non-contrast
Comparison: Previous exam(s).

CLINICAL DATA: Screening.

EXAM:
DIGITAL SCREENING BILATERAL MAMMOGRAM WITH TOMO AND CAD

[L CC synth-2D]
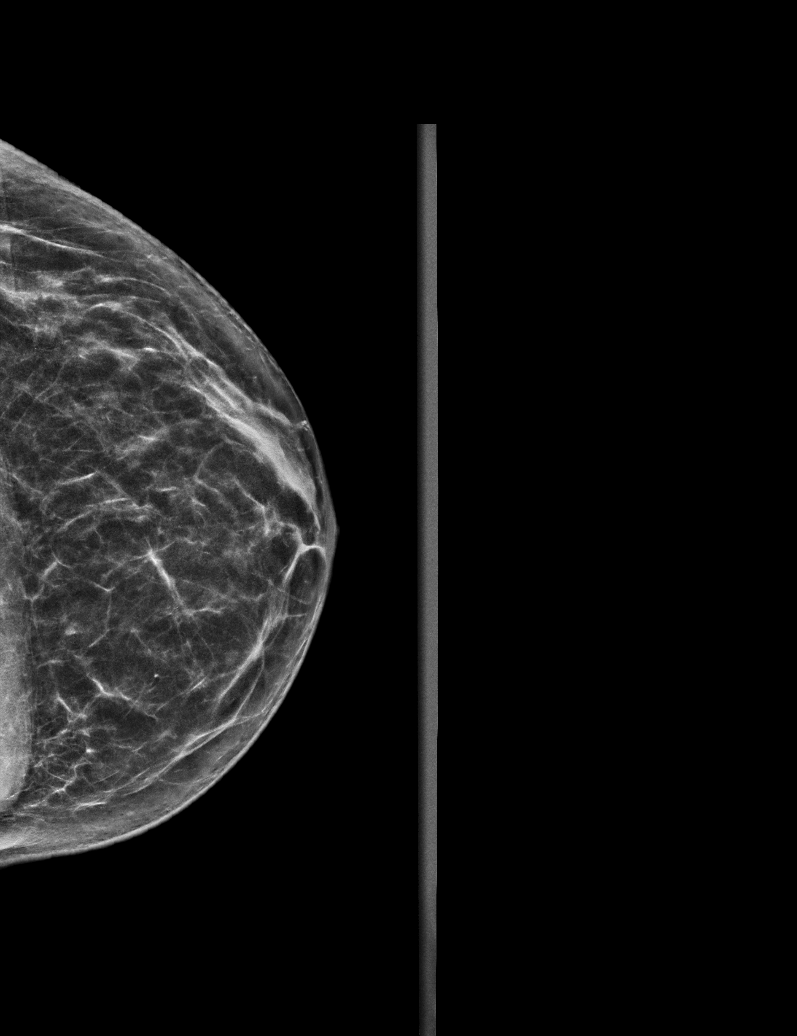

[R MLO synth-2D]
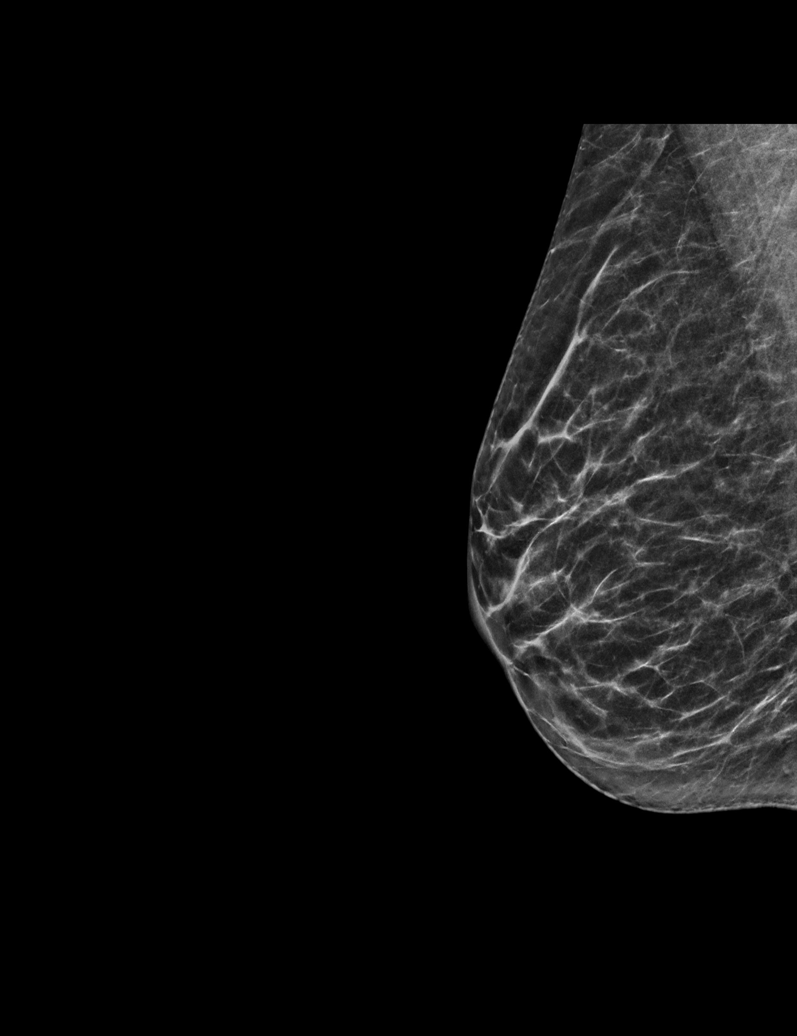

[L MLO synth-2D (1 of 2)]
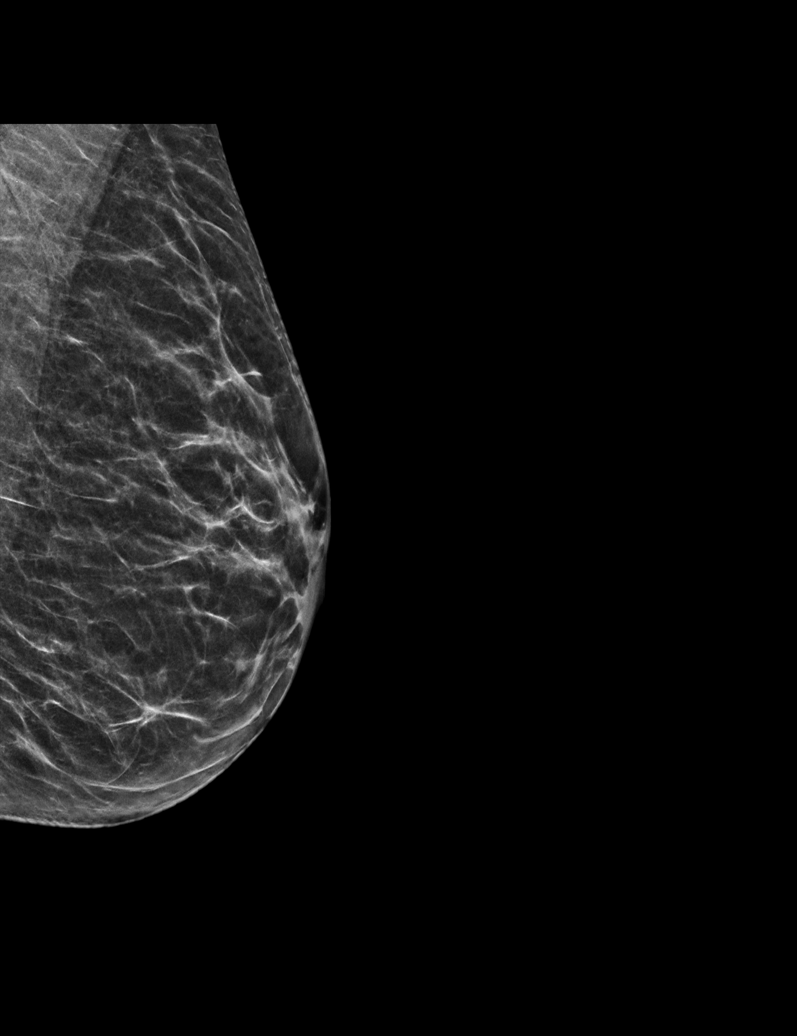

[L MLO synth-2D (2 of 2)]
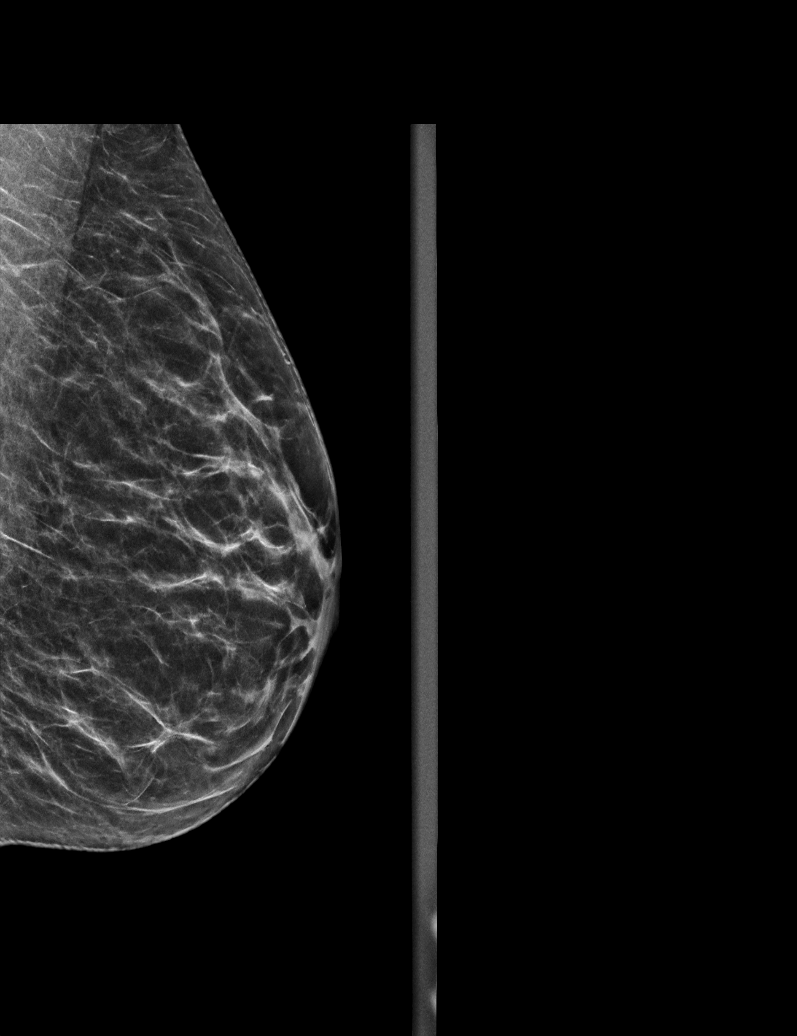

[R CC synth-2D]
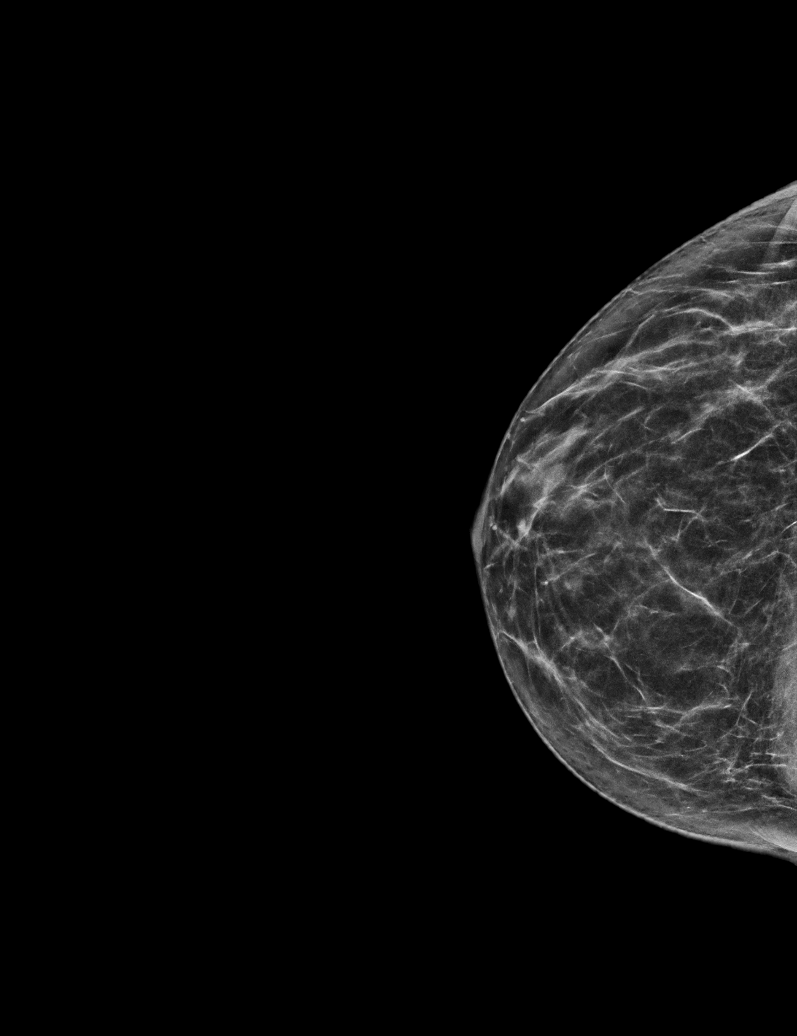

[L MLO tomo · tomo slice 24/47.0]
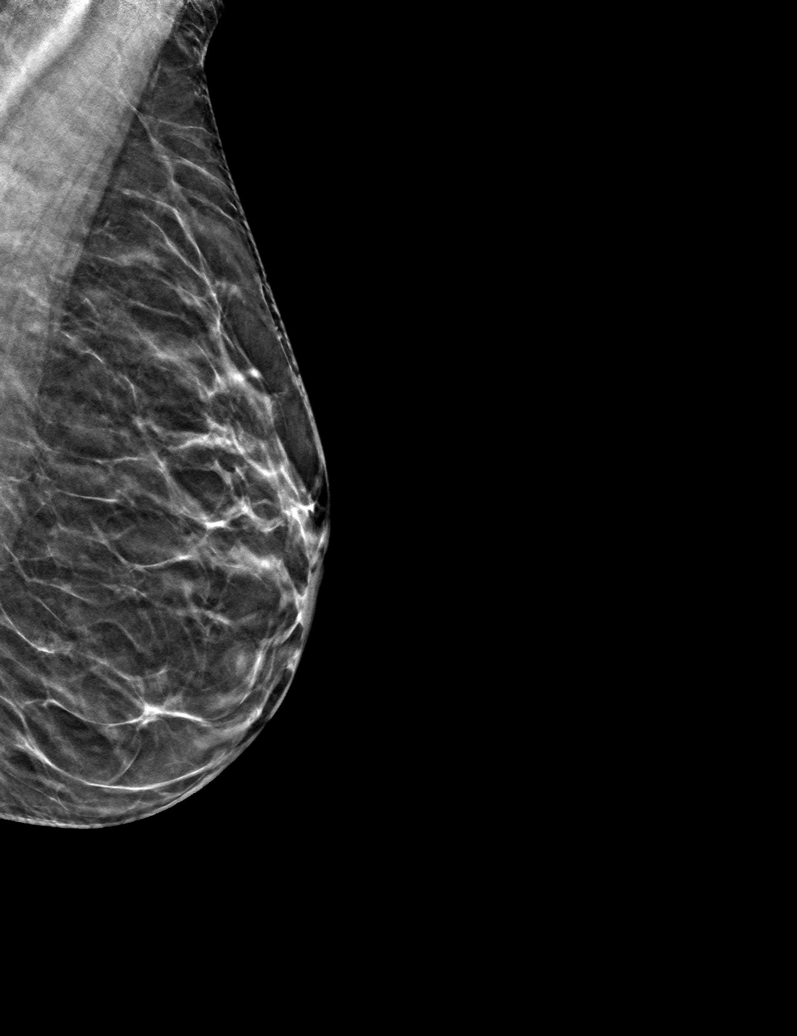

[6 of 30 positions shown; findings below may reference images not displayed]

ACR Breast Density Category b: There are scattered areas of
fibroglandular density.
FINDINGS: There are no findings suspicious for malignancy. Images were
processed with CAD.
IMPRESSION: No mammographic evidence of malignancy. A result letter of this
screening mammogram will be mailed directly to the patient.

RECOMMENDATION:
Screening mammogram in one year. (Code:[id number])

BI-RADS CATEGORY  1: Negative.

## 2022-01-13 DIAGNOSIS — D225 Melanocytic nevi of trunk: Secondary | ICD-10-CM | POA: Diagnosis not present

## 2022-01-13 DIAGNOSIS — L570 Actinic keratosis: Secondary | ICD-10-CM | POA: Diagnosis not present

## 2022-01-13 DIAGNOSIS — D2271 Melanocytic nevi of right lower limb, including hip: Secondary | ICD-10-CM | POA: Diagnosis not present

## 2022-01-13 DIAGNOSIS — L720 Epidermal cyst: Secondary | ICD-10-CM | POA: Diagnosis not present

## 2022-05-27 ENCOUNTER — Other Ambulatory Visit: Payer: Self-pay | Admitting: Body Imaging

## 2022-05-27 DIAGNOSIS — Z1231 Encounter for screening mammogram for malignant neoplasm of breast: Secondary | ICD-10-CM

## 2022-07-03 DIAGNOSIS — N951 Menopausal and female climacteric states: Secondary | ICD-10-CM | POA: Diagnosis not present

## 2022-07-03 DIAGNOSIS — B001 Herpesviral vesicular dermatitis: Secondary | ICD-10-CM | POA: Diagnosis not present

## 2022-07-03 DIAGNOSIS — Z01419 Encounter for gynecological examination (general) (routine) without abnormal findings: Secondary | ICD-10-CM | POA: Diagnosis not present

## 2022-07-07 ENCOUNTER — Ambulatory Visit
Admission: RE | Admit: 2022-07-07 | Discharge: 2022-07-07 | Disposition: A | Discharge status: Home / Self Care | Payer: BC Managed Care – PPO | Source: Ambulatory Visit | Attending: Body Imaging | Admitting: Body Imaging

## 2022-07-07 DIAGNOSIS — Z1231 Encounter for screening mammogram for malignant neoplasm of breast: Secondary | ICD-10-CM

## 2023-04-03 ENCOUNTER — Other Ambulatory Visit: Payer: Self-pay | Admitting: Body Imaging

## 2023-04-03 DIAGNOSIS — Z1231 Encounter for screening mammogram for malignant neoplasm of breast: Secondary | ICD-10-CM

## 2023-04-13 DIAGNOSIS — L570 Actinic keratosis: Secondary | ICD-10-CM | POA: Diagnosis not present

## 2023-04-13 DIAGNOSIS — D2271 Melanocytic nevi of right lower limb, including hip: Secondary | ICD-10-CM | POA: Diagnosis not present

## 2023-04-13 DIAGNOSIS — D2272 Melanocytic nevi of left lower limb, including hip: Secondary | ICD-10-CM | POA: Diagnosis not present

## 2023-04-13 DIAGNOSIS — H61002 Unspecified perichondritis of left external ear: Secondary | ICD-10-CM | POA: Diagnosis not present

## 2023-04-13 DIAGNOSIS — D224 Melanocytic nevi of scalp and neck: Secondary | ICD-10-CM | POA: Diagnosis not present

## 2023-07-09 ENCOUNTER — Ambulatory Visit
Admission: RE | Admit: 2023-07-09 | Discharge: 2023-07-09 | Disposition: A | Discharge status: Home / Self Care | Payer: BC Managed Care – PPO | Source: Ambulatory Visit | Attending: Body Imaging | Admitting: Body Imaging

## 2023-07-09 DIAGNOSIS — Z1231 Encounter for screening mammogram for malignant neoplasm of breast: Secondary | ICD-10-CM

## 2023-07-14 DIAGNOSIS — Z01419 Encounter for gynecological examination (general) (routine) without abnormal findings: Secondary | ICD-10-CM | POA: Diagnosis not present

## 2024-02-24 DIAGNOSIS — G4700 Insomnia, unspecified: Secondary | ICD-10-CM | POA: Diagnosis not present

## 2024-02-24 DIAGNOSIS — Z7184 Encounter for health counseling related to travel: Secondary | ICD-10-CM | POA: Diagnosis not present

## 2024-06-21 DIAGNOSIS — D224 Melanocytic nevi of scalp and neck: Secondary | ICD-10-CM | POA: Diagnosis not present

## 2024-06-21 DIAGNOSIS — L814 Other melanin hyperpigmentation: Secondary | ICD-10-CM | POA: Diagnosis not present

## 2024-06-21 DIAGNOSIS — L570 Actinic keratosis: Secondary | ICD-10-CM | POA: Diagnosis not present

## 2024-06-21 DIAGNOSIS — D2271 Melanocytic nevi of right lower limb, including hip: Secondary | ICD-10-CM | POA: Diagnosis not present

## 2024-06-21 DIAGNOSIS — L821 Other seborrheic keratosis: Secondary | ICD-10-CM | POA: Diagnosis not present

## 2024-06-29 DIAGNOSIS — Z0000 Encounter for general adult medical examination without abnormal findings: Secondary | ICD-10-CM | POA: Diagnosis not present

## 2024-06-29 DIAGNOSIS — Z1321 Encounter for screening for nutritional disorder: Secondary | ICD-10-CM | POA: Diagnosis not present

## 2024-06-29 DIAGNOSIS — Z13220 Encounter for screening for lipoid disorders: Secondary | ICD-10-CM | POA: Diagnosis not present

## 2024-06-29 DIAGNOSIS — Z1329 Encounter for screening for other suspected endocrine disorder: Secondary | ICD-10-CM | POA: Diagnosis not present

## 2024-06-29 DIAGNOSIS — Z130 Encounter for screening for diseases of the blood and blood-forming organs and certain disorders involving the immune mechanism: Secondary | ICD-10-CM | POA: Diagnosis not present

## 2024-06-29 DIAGNOSIS — Z131 Encounter for screening for diabetes mellitus: Secondary | ICD-10-CM | POA: Diagnosis not present

## 2024-06-29 DIAGNOSIS — Z780 Asymptomatic menopausal state: Secondary | ICD-10-CM | POA: Diagnosis not present

## 2024-07-13 DIAGNOSIS — Z1231 Encounter for screening mammogram for malignant neoplasm of breast: Secondary | ICD-10-CM | POA: Diagnosis not present
